# Patient Record
Sex: Male | Born: 1980
Health system: Southern US, Community
[De-identification: ages and names within clinical notes are randomized; demographics above are authoritative.]

---

## 2020-06-12 ENCOUNTER — Emergency Department (HOSPITAL_COMMUNITY): Payer: Medicaid Other

## 2020-06-12 ENCOUNTER — Emergency Department (HOSPITAL_COMMUNITY)
Admission: EM | Admit: 2020-06-12 | Discharge: 2020-06-12 | Disposition: A | Discharge status: Home / Self Care | Payer: Medicaid Other | Attending: Emergency Medicine | Admitting: Emergency Medicine

## 2020-06-12 DIAGNOSIS — Z20822 Contact with and (suspected) exposure to covid-19: Secondary | ICD-10-CM | POA: Insufficient documentation

## 2020-06-12 DIAGNOSIS — Y908 Blood alcohol level of 240 mg/100 ml or more: Secondary | ICD-10-CM | POA: Insufficient documentation

## 2020-06-12 DIAGNOSIS — R000 Tachycardia, unspecified: Secondary | ICD-10-CM | POA: Insufficient documentation

## 2020-06-12 DIAGNOSIS — W228XXA Striking against or struck by other objects, initial encounter: Secondary | ICD-10-CM | POA: Insufficient documentation

## 2020-06-12 DIAGNOSIS — R4182 Altered mental status, unspecified: Secondary | ICD-10-CM | POA: Insufficient documentation

## 2020-06-12 DIAGNOSIS — F10129 Alcohol abuse with intoxication, unspecified: Secondary | ICD-10-CM | POA: Insufficient documentation

## 2020-06-12 DIAGNOSIS — S0083XA Contusion of other part of head, initial encounter: Secondary | ICD-10-CM | POA: Insufficient documentation

## 2020-06-12 DIAGNOSIS — F10920 Alcohol use, unspecified with intoxication, uncomplicated: Secondary | ICD-10-CM

## 2020-06-12 DIAGNOSIS — Z189 Retained foreign body fragments, unspecified material: Secondary | ICD-10-CM

## 2020-06-12 LAB — CBC WITH DIFFERENTIAL/PLATELET
Abs Immature Granulocytes: 0.05 10*3/uL (ref 0.00–0.07)
Basophils Absolute: 0 10*3/uL (ref 0.0–0.1)
Basophils Relative: 0 %
Eosinophils Absolute: 0.1 10*3/uL (ref 0.0–0.5)
Eosinophils Relative: 1 %
HCT: 42.5 % (ref 39.0–52.0)
Hemoglobin: 14.4 g/dL (ref 13.0–17.0)
Immature Granulocytes: 1 %
Lymphocytes Relative: 25 %
Lymphs Abs: 2.5 10*3/uL (ref 0.7–4.0)
MCH: 29.9 pg (ref 26.0–34.0)
MCHC: 33.9 g/dL (ref 30.0–36.0)
MCV: 88.4 fL (ref 80.0–100.0)
Monocytes Absolute: 0.5 10*3/uL (ref 0.1–1.0)
Monocytes Relative: 5 %
Neutro Abs: 6.9 10*3/uL (ref 1.7–7.7)
Neutrophils Relative %: 68 %
Platelets: 158 10*3/uL (ref 150–400)
RBC: 4.81 MIL/uL (ref 4.22–5.81)
RDW: 12.1 % (ref 11.5–15.5)
WBC: 10 10*3/uL (ref 4.0–10.5)
nRBC: 0 % (ref 0.0–0.2)

## 2020-06-12 LAB — RAPID URINE DRUG SCREEN, HOSP PERFORMED
Amphetamines: NOT DETECTED
Barbiturates: NOT DETECTED
Benzodiazepines: NOT DETECTED
Cocaine: NOT DETECTED
Opiates: NOT DETECTED
Tetrahydrocannabinol: NOT DETECTED

## 2020-06-12 LAB — BASIC METABOLIC PANEL
Anion gap: 13 (ref 5–15)
BUN: 10 mg/dL (ref 6–20)
CO2: 20 mmol/L — ABNORMAL LOW (ref 22–32)
Calcium: 8.8 mg/dL — ABNORMAL LOW (ref 8.9–10.3)
Chloride: 106 mmol/L (ref 98–111)
Creatinine, Ser: 0.93 mg/dL (ref 0.61–1.24)
GFR, Estimated: >60 mL/min (ref >60)
Glucose, Bld: 107 mg/dL — ABNORMAL HIGH (ref 70–99)
Potassium: 3.3 mmol/L — ABNORMAL LOW (ref 3.5–5.1)
Sodium: 139 mmol/L (ref 135–145)

## 2020-06-12 LAB — HEPATIC FUNCTION PANEL
ALT: 63 U/L — ABNORMAL HIGH (ref 0–44)
AST: 41 U/L (ref 15–41)
Albumin: 4.1 g/dL (ref 3.5–5.0)
Alkaline Phosphatase: 39 U/L (ref 38–126)
Bilirubin, Direct: 0.2 mg/dL (ref 0.0–0.2)
Indirect Bilirubin: 0.8 mg/dL (ref 0.3–0.9)
Total Bilirubin: 1 mg/dL (ref 0.3–1.2)
Total Protein: 6.7 g/dL (ref 6.5–8.1)

## 2020-06-12 LAB — RESP PANEL BY RT-PCR (FLU A&B, COVID) ARPGX2
Influenza A by PCR: NEGATIVE
Influenza B by PCR: NEGATIVE
SARS Coronavirus 2 by RT PCR: NEGATIVE

## 2020-06-12 LAB — ETHANOL: Alcohol, Ethyl (B): 270 mg/dL — ABNORMAL HIGH (ref <10)

## 2020-06-12 LAB — SALICYLATE LEVEL: Salicylate Lvl: <7 mg/dL — ABNORMAL LOW (ref 7.0–30.0)

## 2020-06-12 LAB — ACETAMINOPHEN LEVEL: Acetaminophen (Tylenol), Serum: <10 ug/mL — ABNORMAL LOW (ref 10–30)

## 2020-06-12 LAB — OSMOLALITY: Osmolality: 358 mOsm/kg (ref 275–295)

## 2020-06-12 MED ORDER — ONDANSETRON HCL 4 MG/2ML IJ SOLN
4.0000 mg | Freq: Once | INTRAMUSCULAR | Status: AC
  Administered 2020-06-12: 4 mg via INTRAVENOUS
  Filled 2020-06-12: qty 2

## 2020-06-12 MED ORDER — LORAZEPAM 2 MG/ML IJ SOLN
INTRAMUSCULAR | Status: AC
  Administered 2020-06-12: 19:00:00 1 mg via INTRAMUSCULAR
  Filled 2020-06-12: qty 1

## 2020-06-12 MED ORDER — KETOROLAC TROMETHAMINE 15 MG/ML IJ SOLN
15.0000 mg | Freq: Once | INTRAMUSCULAR | Status: AC
  Administered 2020-06-12: 22:00:00 15 mg via INTRAVENOUS
  Filled 2020-06-12: qty 1

## 2020-06-12 MED ORDER — SODIUM CHLORIDE 0.9 % IV BOLUS
1000.0000 mL | Freq: Once | INTRAVENOUS | Status: AC
  Administered 2020-06-12: 21:00:00 1000 mL via INTRAVENOUS

## 2020-06-12 MED ORDER — LORAZEPAM 2 MG/ML IJ SOLN: 1.0000 mg | Freq: Once | INTRAMUSCULAR | Status: AC

## 2020-06-12 MED ORDER — DICYCLOMINE HCL 10 MG PO CAPS
10.0000 mg | ORAL_CAPSULE | Freq: Once | ORAL | Status: AC
  Administered 2020-06-12: 10 mg via ORAL
  Filled 2020-06-12: qty 1

## 2020-06-12 MED ORDER — MORPHINE SULFATE (PF) 2 MG/ML IV SOLN
2.0000 mg | Freq: Once | INTRAVENOUS | Status: AC
  Administered 2020-06-12: 22:00:00 2 mg via INTRAVENOUS
  Filled 2020-06-12: qty 1

## 2020-06-12 NOTE — Discharge Instructions (Addendum)
Your CT scan of the brain and spine did not show acute injuries.  I included copies of the reports for your medical records.  Your blood work showed that you had a very high alcohol level, which is likely from drinking the hand sanitizer.  When you were clinically sober, we felt it was safe to discharge you.  We gave you 2 mg morphine, 1 mg ativan, 15 mg toradol, 4 mg zofran, and 10 mg bentyl for your pain and nausea in the ED.  You have small pieces of metal still in your right arm.  These may be the needle that broke off a few months ago.  These do not looked infected, and the safest option is to leave these alone at this time.  In the future, if there is new redness or pus draining from that site, you will need to see a general surgeon - I included their office number above.
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
## 2020-06-12 NOTE — ED Notes (Signed)
Date and time results received: 06/12/20 1924 (use smartphrase ".now" to insert current time)  Test: Serum Osmo Critical Value: 358  Name of Provider Notified: Trifan  Orders Received? Or Actions Taken?: Continue to monitor patient.
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
## 2020-10-08 ENCOUNTER — Emergency Department (HOSPITAL_COMMUNITY)
Admission: EM | Admit: 2020-10-08 | Discharge: 2020-10-08 | Disposition: A | Discharge status: Home / Self Care | Payer: Medicaid Other | Attending: Emergency Medicine | Admitting: Emergency Medicine

## 2020-10-08 ENCOUNTER — Emergency Department (HOSPITAL_COMMUNITY): Payer: Medicaid Other

## 2020-10-08 ENCOUNTER — Encounter (HOSPITAL_COMMUNITY): Payer: Self-pay | Admitting: Student

## 2020-10-08 ENCOUNTER — Other Ambulatory Visit: Payer: Self-pay

## 2020-10-08 DIAGNOSIS — K0889 Other specified disorders of teeth and supporting structures: Secondary | ICD-10-CM

## 2020-10-08 DIAGNOSIS — W06XXXA Fall from bed, initial encounter: Secondary | ICD-10-CM | POA: Insufficient documentation

## 2020-10-08 DIAGNOSIS — W19XXXA Unspecified fall, initial encounter: Secondary | ICD-10-CM

## 2020-10-08 DIAGNOSIS — Y92149 Unspecified place in prison as the place of occurrence of the external cause: Secondary | ICD-10-CM | POA: Insufficient documentation

## 2020-10-08 DIAGNOSIS — S0990XA Unspecified injury of head, initial encounter: Secondary | ICD-10-CM

## 2020-10-08 DIAGNOSIS — M545 Low back pain, unspecified: Secondary | ICD-10-CM | POA: Insufficient documentation

## 2020-10-08 DIAGNOSIS — M542 Cervicalgia: Secondary | ICD-10-CM | POA: Insufficient documentation

## 2020-10-08 MED ORDER — PENICILLIN V POTASSIUM 500 MG PO TABS: 500.0000 mg | ORAL_TABLET | Freq: Four times a day (QID) | ORAL | 0 refills | Status: AC

## 2020-10-08 MED ORDER — HYDROCODONE-ACETAMINOPHEN 5-325 MG PO TABS
1.0000 | ORAL_TABLET | Freq: Once | ORAL | Status: AC
  Administered 2020-10-08: 1 via ORAL
  Filled 2020-10-08: qty 1

## 2020-10-08 MED ORDER — PENICILLIN V POTASSIUM 250 MG PO TABS
500.0000 mg | ORAL_TABLET | Freq: Once | ORAL | Status: AC
  Administered 2020-10-08: 500 mg via ORAL
  Filled 2020-10-08: qty 2

## 2020-10-08 MED ORDER — NAPROXEN 500 MG PO TABS: 500.0000 mg | ORAL_TABLET | Freq: Two times a day (BID) | ORAL | 0 refills | Status: AC | PRN

## 2020-10-08 MED ORDER — NAPROXEN 250 MG PO TABS
500.0000 mg | ORAL_TABLET | Freq: Once | ORAL | Status: AC
  Administered 2020-10-08: 500 mg via ORAL
  Filled 2020-10-08: qty 2
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
## 2020-10-08 NOTE — ED Notes (Signed)
Pt ambulated in hallway. Pt had steady gait. No assistance needed. Complaint sof feeling lightheaded and dizzy. PA made aware.

## 2020-10-08 NOTE — ED Provider Notes (Signed)
[facility] EMERGENCY DEPARTMENT Provider Note   CSN: [id number] Arrival date & time: 10/08/20  0138     History Chief Complaint  Patient presents with  . Fall    Chase Bernard is a 40 y.o. male without significant past medical hx who presents to the ED for evaluation S/p fall shortly PTA. Patient reports that he has fallen from his top bunk (approximately 6 feet) twice, once today as well as a couple of nights ago. He was not sure that he fell tonight until staff told him. He is having pain to his head, neck, and lower back. Per jail staff patient was confused S/p fall. He has had some intermittent dizziness since initial fall.  He also mentions that he had a left upper dental abscess burst within the past few days- having pain to this area as well. No alleviating/aggravating factors. Denies fever, chills, numbness, weakness, chest pain, dyspnea, or abdominal pain.  HPI     History reviewed. No pertinent past medical history.  There are no problems to display for this patient.   History reviewed. No pertinent surgical history.     History reviewed. No pertinent family history.     Home Medications Prior to Admission medications   Not on File    Allergies    Patient has no known allergies.  Review of Systems   Review of Systems  Constitutional: Negative for chills and fever.  HENT: Positive for dental problem. Negative for sore throat and trouble swallowing.   Eyes: Negative for visual disturbance.  Respiratory: Negative for shortness of breath.   Cardiovascular: Negative for chest pain.  Gastrointestinal: Negative for abdominal pain and vomiting.  Musculoskeletal: Positive for back pain and neck pain.  Neurological: Positive for dizziness and headaches. Negative for weakness and numbness.  All other systems reviewed and are negative.   Physical Exam Updated Vital Signs BP 124/73 (BP Location: Left Arm)   Pulse 89   Temp 98 F (36.7 C)  (Oral)   Resp (!) 22   SpO2 96%   Physical Exam Vitals and nursing note reviewed.  Constitutional:      General: He is not in acute distress. HENT:     Head: No raccoon eyes, Battle's sign or laceration.      Right Ear: No hemotympanum.     Left Ear: No hemotympanum.     Nose: Nose normal.     Mouth/Throat:     Pharynx: Oropharynx is clear. Uvula midline.      Comments: Posterior oropharynx is symmetric appearing. Patient tolerating own secretions without difficulty. No trismus. No drooling. No hot potato voice. No swelling beneath the tongue, submandibular compartment is soft.  Eyes:     Extraocular Movements: Extraocular movements intact.     Comments: PERRL.   Neck:     Comments: C-collar in place & maintained precautions throughout exam. Diffuse midline & bilateral paraspinal muscle tenderness.  Cardiovascular:     Rate and Rhythm: Normal rate and regular rhythm.  Pulmonary:     Effort: Pulmonary effort is normal. No respiratory distress.     Breath sounds: Normal breath sounds.  Chest:     Chest wall: No tenderness.  Abdominal:     General: There is no distension.     Palpations: Abdomen is soft.     Tenderness: There is no abdominal tenderness. There is no guarding or rebound.  Musculoskeletal:     Cervical back: No edema or erythema.     Comments:  Upper/lower extremities: No focal bony tenderness Back: Tenderness to the diffuse midline lumbar spine.   Lymphadenopathy:     Cervical: No cervical adenopathy.  Neurological:     Mental Status: He is alert.     Comments: Alert, clear speech, sensation grossly intact x 4. 5/5 symmetric grip strength and strength with plantar/dorsiflexion.   Psychiatric:        Mood and Affect: Mood normal.        Behavior: Behavior normal.     ED Results / Procedures / Treatments   Labs (all labs ordered are listed, but only abnormal results are displayed) Labs Reviewed - No data to display  EKG None  Radiology DG Lumbar  Spine Complete  Result Date: 10/08/2020 CLINICAL DATA:  Fall EXAM: LUMBAR SPINE - COMPLETE 4+ VIEW COMPARISON:  Lumbar spine 06/12/2020 FINDINGS: There is no evidence of lumbar spine fracture. Alignment is normal. Intervertebral disc spaces are maintained. IMPRESSION: Negative. Electronically Signed   By: Charlett Nose M.D.   On: 10/08/2020 02:15   CT Head Wo Contrast  Result Date: 10/08/2020 CLINICAL DATA:  Fall from bunk bed EXAM: CT HEAD WITHOUT CONTRAST TECHNIQUE: Contiguous axial images were obtained from the base of the skull through the vertex without intravenous contrast. COMPARISON:  06/12/2020 FINDINGS: Brain: No acute intracranial abnormality. Specifically, no hemorrhage, hydrocephalus, mass lesion, acute infarction, or significant intracranial injury. Vascular: No hyperdense vessel or unexpected calcification. Skull: No acute calvarial abnormality. Sinuses/Orbits: No acute findings Other: None IMPRESSION: Normal study. Electronically Signed   By: Charlett Nose M.D.   On: 10/08/2020 02:20   CT Cervical Spine Wo Contrast  Result Date: 10/08/2020 CLINICAL DATA:  Midline neck tenderness fall EXAM: CT CERVICAL SPINE WITHOUT CONTRAST TECHNIQUE: Multidetector CT imaging of the cervical spine was performed without intravenous contrast. Multiplanar CT image reconstructions were also generated. COMPARISON:  None. FINDINGS: Alignment: Reversal of normal cervical lordosis may be positional or due to muscle spasm. No static subluxation. Skull base and vertebrae: No acute fracture. No primary bone lesion or focal pathologic process. Soft tissues and spinal canal: No prevertebral fluid or swelling. No visible canal hematoma. Disc levels:  No spinal canal stenosis. Upper chest: Negative. Other: None. IMPRESSION: 1. No acute fracture or static subluxation of the cervical spine. 2. Reversal of normal cervical lordosis may be positional or due to muscle spasm. Electronically Signed   By: Deatra Robinson M.D.   On:  10/08/2020 02:37    Procedures Procedures   Medications Ordered in ED Medications  naproxen (NAPROSYN) tablet 500 mg (500 mg Oral Given 10/08/20 0332)  HYDROcodone-acetaminophen (NORCO/VICODIN) 5-325 MG per tablet 1 tablet (1 tablet Oral Given 10/08/20 0424)  penicillin v potassium (VEETID) tablet 500 mg (500 mg Oral Given 10/08/20 0424)    ED Course  I have reviewed the triage vital signs and the nursing notes.  Pertinent labs & imaging results that were available during my care of the patient were reviewed by me and considered in my medical decision making (see chart for details).    MDM Rules/Calculators/A&P                          Patient presents to the ED for evaluation S/p fall from top bunk x 2 over the past few days also with complaints of dental pain. Nontoxic, vitals without significant abnormality.   Additional history obtained:  Additional history obtained from discussion with officer @ bedside.   Imaging Studies ordered:  I ordered imaging studies which included CT head/cspine & L spine x-ray, I independently reviewed, formal radiology impression shows:  CT head: Normal study CT C spine: 1. No acute fracture or static subluxation of the cervical spine. 2. Reversal of normal cervical lordosis may be positional or due to muscle spasm L spine x-ray: Negative.   ED Course:  CT head without bleed.  CT cervical spine without fracture.  T-spine is nontender.  L-spine without fracture on x-ray.  No neurodeficits.  Patient is ambulatory in the emergency department, does feel a bit dizzy at times however has steady gait.  Suspect degree of traumatic brain injury/concussion-discussed brain rest.  No chest/abdominal tenderness.  No tenderness to the extremities noted.  In terms of his dental discomfort, he states is causing him a lot of pain, he was given a one-time dose of Norco in the ED as well as a dose of naproxen and Pen-VK, he will be discharged with naproxen and Pen-VK.  No  abscess on exam.  No evidence of deep space infection or ludwigs angina at this time. Overall appears appropriate for discharge.   I discussed results, treatment plan, need for follow-up, and return precautions with the patient. Provided opportunity for questions, patient confirmed understanding and is in agreement with plan.   Portions of this note were generated with Scientist, clinical (histocompatibility and immunogenetics). Dictation errors may occur despite best attempts at proofreading.  Final Clinical Impression(s) / ED Diagnoses Final diagnoses:  Fall, initial encounter  Injury of head, initial encounter  Pain, dental    Rx / DC Orders ED Discharge Orders         Ordered    naproxen (NAPROSYN) 500 MG tablet  2 times daily PRN        10/08/20 0416    penicillin v potassium (VEETID) 500 MG tablet  4 times daily        10/08/20 0416           Cherly Anderson, PA-C 10/08/20 0426    Marily Memos, MD 10/08/20 [phone]

## 2020-10-08 NOTE — Discharge Instructions (Addendum)
You were seen in the emergency department today following a head injury.  We suspect that you have a concussion, otherwise known and as a mild traumatic brain injury.  Your head CT scan did not show any new abnormality such as a brain bleed.  The CT of your neck did not show any fractures and the x-ray of your back did not show any fractures.  We are concerned that you may have a dental infection.  We are starting you on penicillin to take 4 times per day for the next 1 week.  We are also sending you home with naproxen to help with your dental pain as well as your headaches.  - Naproxen is a nonsteroidal anti-inflammatory medication that will help with pain and swelling. Be sure to take this medication as prescribed with food, 1 pill every 12 hours,  It should be taken with food, as it can cause stomach upset, and more seriously, stomach bleeding. Do not take other nonsteroidal anti-inflammatory medications with this such as Advil, Motrin, Aleve, Mobic, Goodie Powder, or Motrin.    You make take Tylenol per over the counter dosing with these medications.   We have prescribed you new medication(s) today. Discuss the medications prescribed today with your pharmacist as they can have adverse effects and interactions with your other medicines including over the counter and prescribed medications. Seek medical evaluation if you start to experience new or abnormal symptoms after taking one of these medicines, seek care immediately if you start to experience difficulty breathing, feeling of your throat closing, facial swelling, or rash as these could be indications of a more serious allergic reaction  Further ED Instructions:   Please call and follow-up with your primary care provider as well as a dentist within the next 3 days.  In the meantime we would like you to avoid strenuous/over exertional activities such as sports or running.  Please avoid excess screen time utilizing cell phones, computers, or the TV.   Please avoid activities that require significant amount of concentration.  Please try to rest as much as possible.  Return to the ER for new or worsening symptoms or any other concerns that you may have.

## 2021-09-24 IMAGING — CR DG LUMBAR SPINE COMPLETE 4+V
5 series · 5 of 5 positions shown · non-contrast
Comparison: Lumbar spine 06/12/2020

CLINICAL DATA: Fall

EXAM:
LUMBAR SPINE - COMPLETE 4+ VIEW

[l-spine ap]
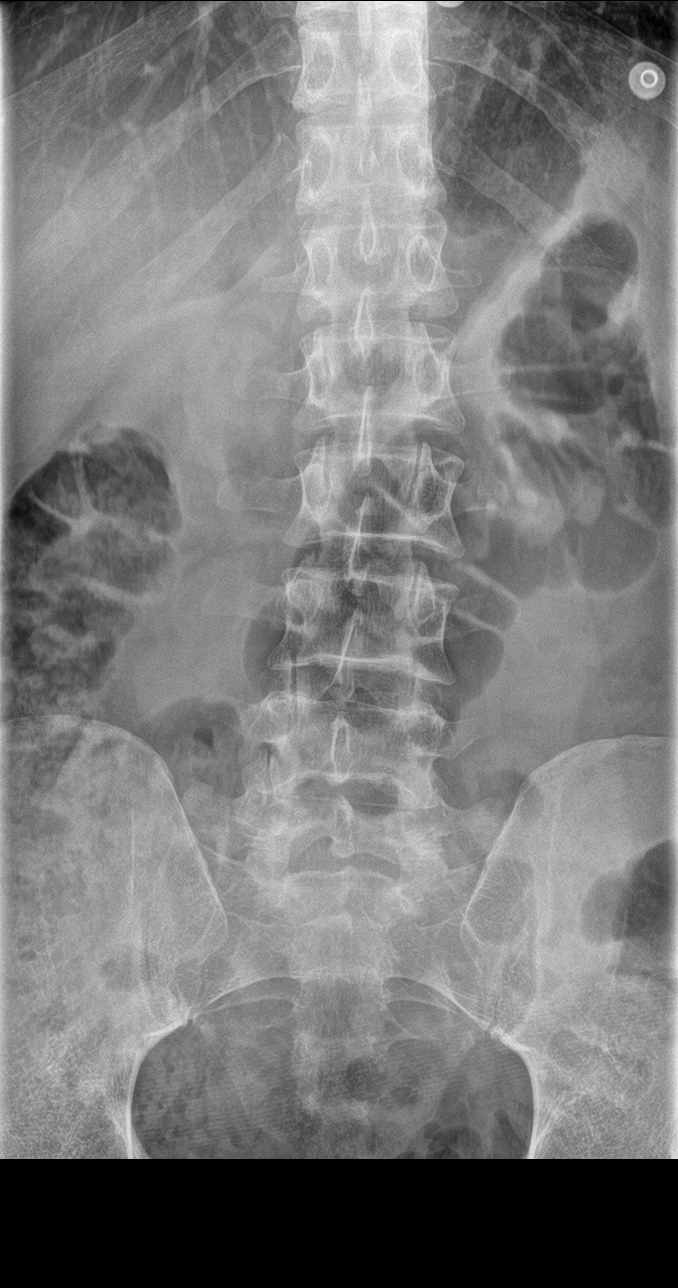

[l-spine obl (1 of 2)]
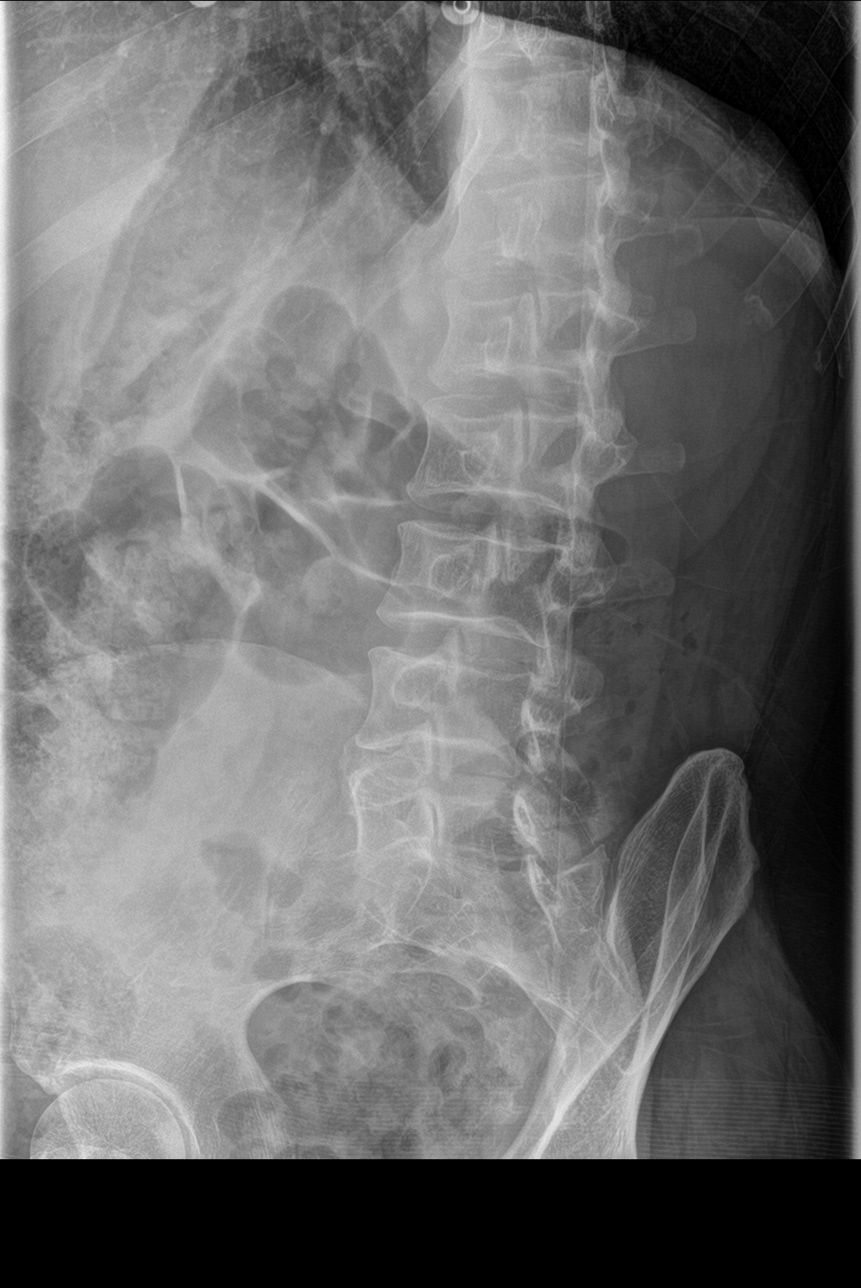

[l-spine obl (2 of 2)]
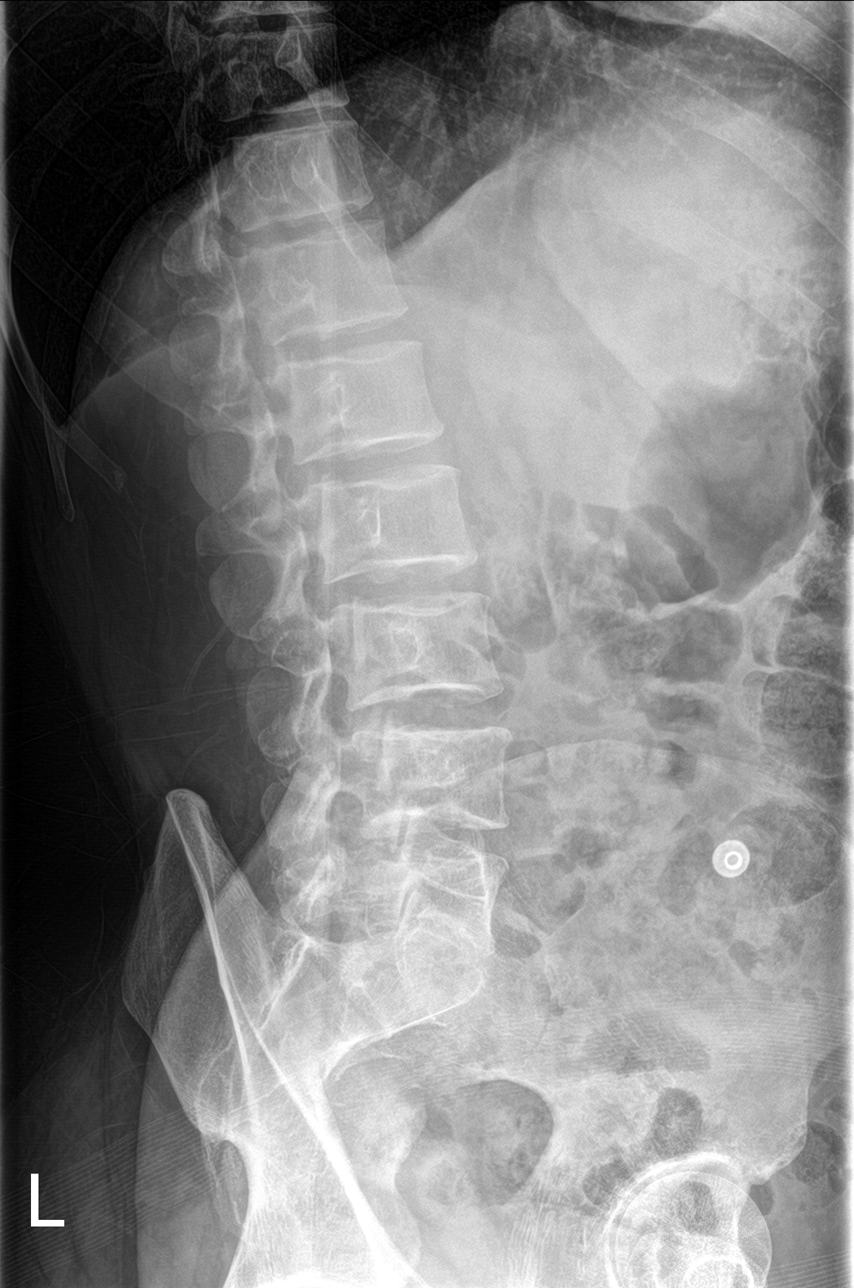

[l-spine lat]
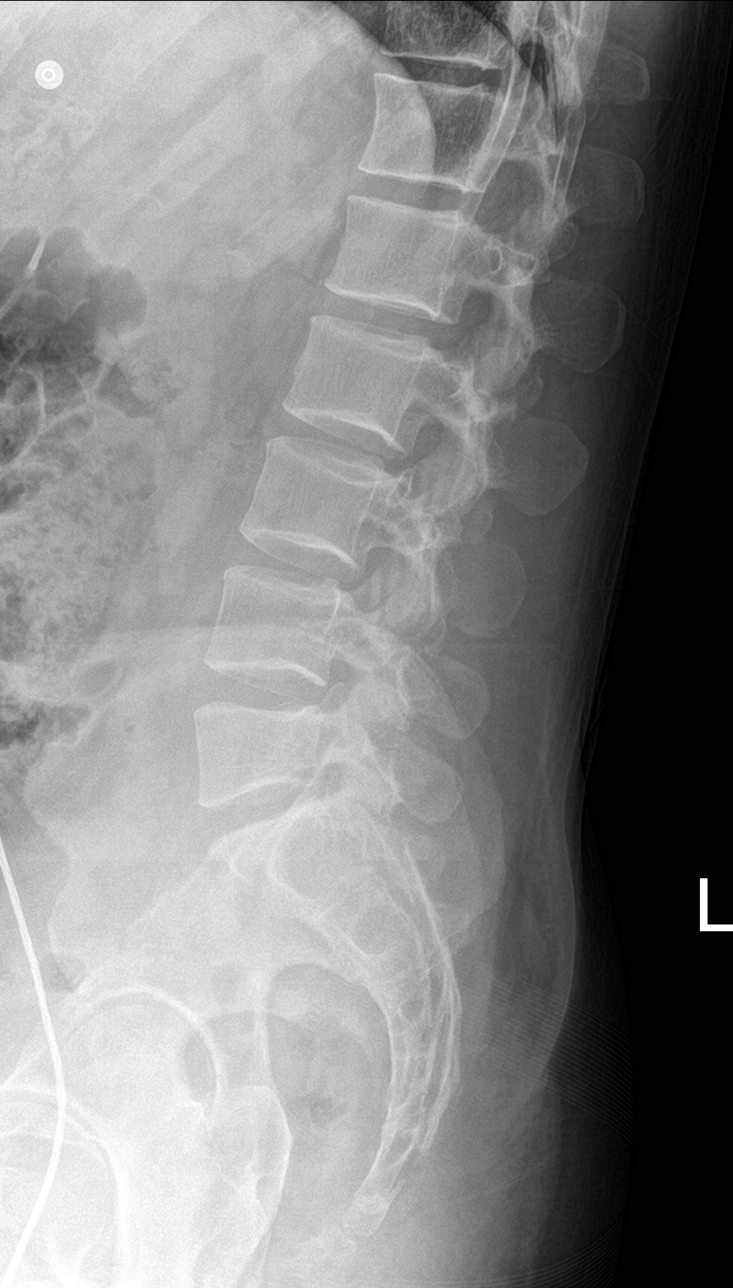

[l-spine spot]
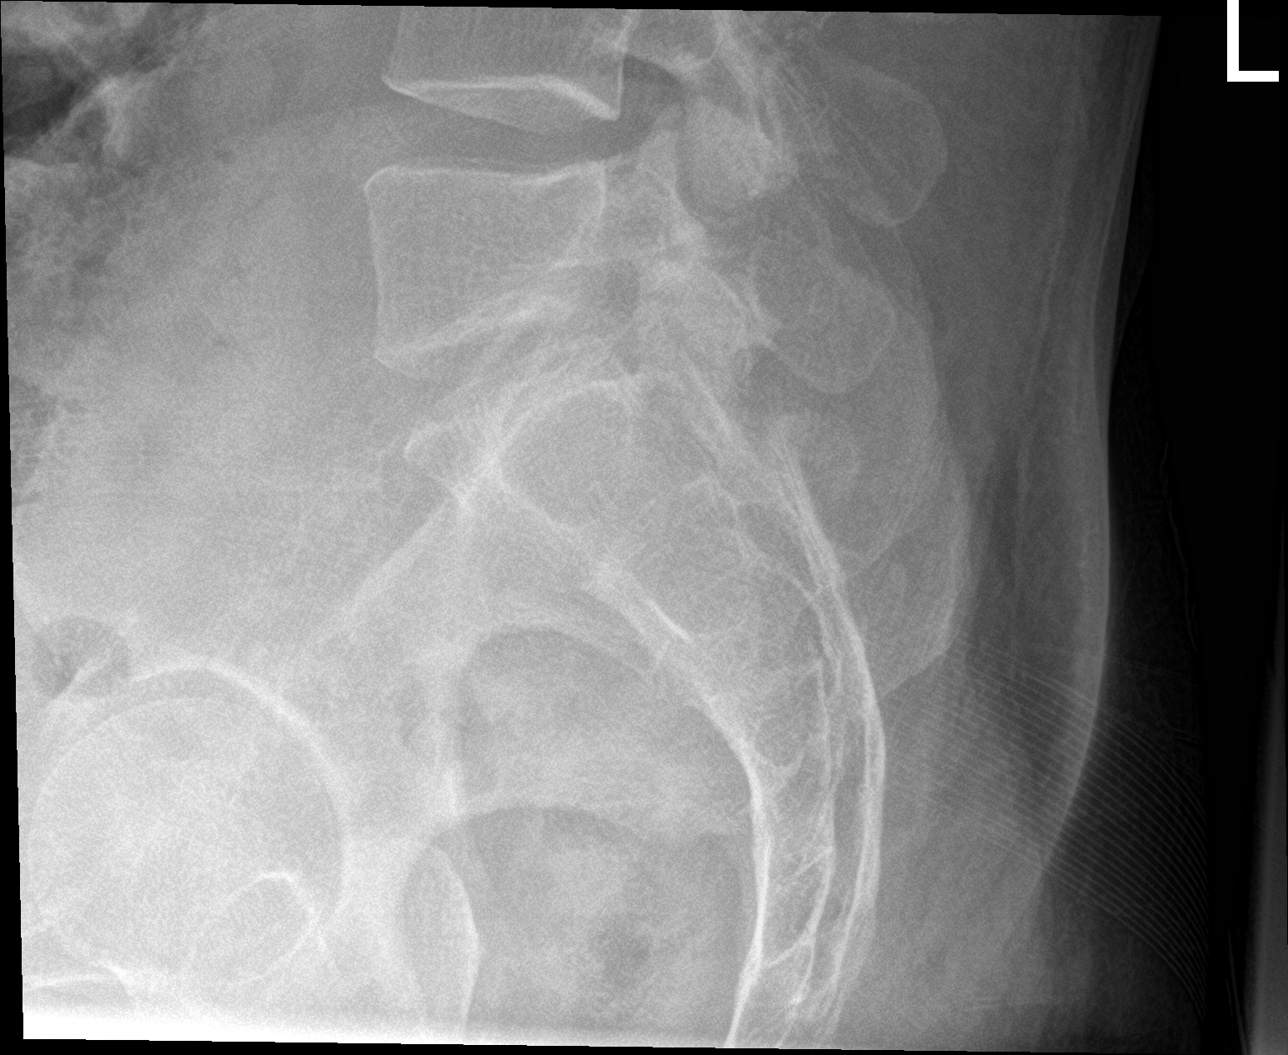

[5 of 5 positions shown; findings below may reference images not displayed]

FINDINGS: There is no evidence of lumbar spine fracture. Alignment is normal.
Intervertebral disc spaces are maintained.
IMPRESSION: Negative.

## 2022-10-13 ENCOUNTER — Telehealth: Payer: Self-pay

## 2022-10-13 NOTE — Telephone Encounter (Signed)
Attempted to contact patient-no working #. AS, CMA

## 2023-03-21 ENCOUNTER — Telehealth: Payer: Self-pay

## 2023-03-21 NOTE — Telephone Encounter (Signed)
  [employer] Managed Care   Unsuccessful Outreach Note  03/21/2023 Name: Chase Bernard MRN: [id number] DOB: 1980/09/10  Referred by: Pcp, No Reason for referral : No chief complaint on file.   An unsuccessful telephone outreach was attempted today. The patient was referred to the case management team for assistance with care management and care coordination.   Follow Up Plan: If patient returns call to provider office, please advise to call [employer]ed[employer] Care Management Care Guide Nicholes Rough* at [phone]*  Nicholes Rough, CMA Care Guide [facility]
